# Patient Record
Sex: Female | Born: 1965 | Race: Black or African American | Hispanic: No | Marital: Single | Smoking: Current every day smoker
Health system: Southern US, Community
[De-identification: ages and names within clinical notes are randomized; demographics above are authoritative.]

## PROBLEM LIST (undated history)

## (undated) DIAGNOSIS — H579 Unspecified disorder of eye and adnexa: Secondary | ICD-10-CM

## (undated) HISTORY — PX: ABDOMINAL HYSTERECTOMY: SHX81

## (undated) HISTORY — PX: EYE SURGERY: SHX253

---

## 2002-01-17 ENCOUNTER — Emergency Department (HOSPITAL_COMMUNITY): Admission: EM | Admit: 2002-01-17 | Discharge: 2002-01-17 | Payer: Self-pay | Admitting: Unknown Physician Specialty

## 2003-06-30 ENCOUNTER — Emergency Department (HOSPITAL_COMMUNITY): Admission: AD | Admit: 2003-06-30 | Discharge: 2003-07-01 | Payer: Self-pay | Admitting: Emergency Medicine

## 2003-10-16 ENCOUNTER — Emergency Department (HOSPITAL_COMMUNITY): Admission: EM | Admit: 2003-10-16 | Discharge: 2003-10-16 | Payer: Self-pay | Admitting: Emergency Medicine

## 2004-04-23 ENCOUNTER — Emergency Department (HOSPITAL_COMMUNITY): Admission: EM | Admit: 2004-04-23 | Discharge: 2004-04-23 | Payer: Self-pay | Admitting: Emergency Medicine

## 2017-01-17 ENCOUNTER — Ambulatory Visit (HOSPITAL_COMMUNITY)
Admission: EM | Admit: 2017-01-17 | Discharge: 2017-01-17 | Disposition: A | Discharge status: Home / Self Care | Payer: Medicare Other | Attending: Internal Medicine | Admitting: Internal Medicine

## 2017-01-17 ENCOUNTER — Encounter (HOSPITAL_COMMUNITY): Payer: Self-pay | Admitting: *Deleted

## 2017-01-17 DIAGNOSIS — W57XXXA Bitten or stung by nonvenomous insect and other nonvenomous arthropods, initial encounter: Secondary | ICD-10-CM

## 2017-01-17 DIAGNOSIS — S00261A Insect bite (nonvenomous) of right eyelid and periocular area, initial encounter: Secondary | ICD-10-CM

## 2017-01-17 HISTORY — DX: Unspecified disorder of eye and adnexa: H57.9

## 2017-01-17 NOTE — ED Triage Notes (Signed)
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

## 2017-01-17 NOTE — Discharge Instructions (Signed)
Continue warm compresses. Take over the counter antihistamine for insect bite. Can get baby shampoo for lid scrubs. Monitor for worsening of symptoms, increased swelling/redness/warmth, follow up for reevaluation.

## 2017-01-17 NOTE — ED Provider Notes (Signed)
CSN: [id number]     Arrival date & time 01/17/17  1752 History   None    Chief Complaint  Patient presents with  . 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 She is noticing itchiness, burning sensation around the bite with swelling. She has been using warm compresses with minimal relief. No redness in the eye, discharge. No pain with eye movement. Denies fever, chills, night sweats. Denies trouble breathing, swelling of throat, trouble swallowing.       Past Medical History:  Diagnosis Date  . Eye disorder    Past Surgical History:  Procedure Laterality Date  . ABDOMINAL HYSTERECTOMY    . EYE SURGERY     History reviewed. No pertinent family history. Social History  Substance Use Topics  . Smoking status: Current Every Day Smoker  . Smokeless tobacco: Not on file  . Alcohol use No   OB History    No data available     Review of Systems  Reason unable to perform ROS: HPI as above.    Allergies  Patient has no known allergies.  Home Medications   Prior to Admission medications   Not on File   Meds Ordered and Administered this Visit  Medications - No data to display  BP (!) 142/104 (BP Location: Right Arm)   Pulse 78   Temp 98.6 F (37 C) (Oral)   Resp 18   SpO2 100%  No data found.   Physical Exam  Constitutional: She is oriented to person, place, and time. She appears well-developed and well-nourished. No distress.  HENT:  Head: Normocephalic and atraumatic.  Eyes:  Blind in right eye. No injection of the conjunctivae. Normal EOM. Redness and swelling of the right upper lid, no increased warmth.   Left eye with normal exam.   Neurological: She is alert and oriented to person, place, and time.  Psychiatric: She has a normal mood and affect. Her behavior is normal. Judgment normal.     Urgent Care Course     Procedures (including critical care time)  Labs Review Labs Reviewed - No data to display  Imaging Review No results found.     MDM   1. Insect bite, initial encounter    Discussed with patient local reaction to bug bite to the right eyelid. No signs of infection today. Continue warm compresses. Can take otc antihistamines. Baby shampoo for lid scrubs as needed. Patient to monitor for worsening of symptoms, pain with eye movement, to follow up for reevaluation.    Belinda FisherYu, Darren Caldron V, PA-C 01/17/17 1934

## 2019-03-05 ENCOUNTER — Other Ambulatory Visit: Payer: Self-pay | Admitting: Family Medicine

## 2019-03-05 DIAGNOSIS — Z1231 Encounter for screening mammogram for malignant neoplasm of breast: Secondary | ICD-10-CM

## 2019-08-10 ENCOUNTER — Other Ambulatory Visit: Payer: Self-pay

## 2019-08-10 DIAGNOSIS — Z20822 Contact with and (suspected) exposure to covid-19: Secondary | ICD-10-CM

## 2019-08-11 LAB — NOVEL CORONAVIRUS, NAA: SARS-CoV-2, NAA: NOT DETECTED

## 2020-03-23 ENCOUNTER — Other Ambulatory Visit: Payer: Self-pay | Admitting: General Practice

## 2020-03-23 DIAGNOSIS — Z1231 Encounter for screening mammogram for malignant neoplasm of breast: Secondary | ICD-10-CM

## 2020-04-07 ENCOUNTER — Other Ambulatory Visit: Payer: Self-pay

## 2020-04-07 ENCOUNTER — Ambulatory Visit
Admission: RE | Admit: 2020-04-07 | Discharge: 2020-04-07 | Disposition: A | Discharge status: Home / Self Care | Payer: Medicare Other | Source: Ambulatory Visit | Attending: General Practice | Admitting: General Practice

## 2020-04-07 DIAGNOSIS — Z1231 Encounter for screening mammogram for malignant neoplasm of breast: Secondary | ICD-10-CM

## 2020-10-10 ENCOUNTER — Other Ambulatory Visit: Payer: Self-pay

## 2020-10-10 ENCOUNTER — Encounter (HOSPITAL_COMMUNITY): Payer: Self-pay

## 2020-10-10 ENCOUNTER — Ambulatory Visit (HOSPITAL_COMMUNITY)
Admission: EM | Admit: 2020-10-10 | Discharge: 2020-10-10 | Disposition: A | Discharge status: Home / Self Care | Payer: Medicare Other | Attending: Urgent Care | Admitting: Urgent Care

## 2020-10-10 DIAGNOSIS — K529 Noninfective gastroenteritis and colitis, unspecified: Secondary | ICD-10-CM

## 2020-10-10 DIAGNOSIS — R112 Nausea with vomiting, unspecified: Secondary | ICD-10-CM

## 2020-10-10 MED ORDER — ONDANSETRON 4 MG PO TBDP
ORAL_TABLET | ORAL | Status: AC
  Filled 2020-10-10: qty 2

## 2020-10-10 MED ORDER — ONDANSETRON 8 MG PO TBDP: 8.0000 mg | ORAL_TABLET | Freq: Three times a day (TID) | ORAL | 0 refills | Status: DC | PRN

## 2020-10-10 MED ORDER — ONDANSETRON 4 MG PO TBDP
8.0000 mg | ORAL_TABLET | Freq: Once | ORAL | Status: AC
  Administered 2020-10-10: 8 mg via ORAL

## 2020-10-10 NOTE — ED Triage Notes (Signed)
Pt presents with vomiting and headache since waking up this morning.

## 2020-10-10 NOTE — ED Provider Notes (Signed)
[facility]   MRN: [id number] DOB: [birth year]  Subjective:   Toni Henderson is a 55 y.o. female presenting for acute onset this morning of persistent nausea with vomiting and a subsequent headache.  Denies confusion, weakness, numbness or tingling, chest pain, abdominal pain, diarrhea, constipation, hematemesis, bloody stools.  Denies history of stroke, heart disease, MI.  She did try an over-the-counter stomach medication but she cannot remember the name of it but states that it did not help.  No current facility-administered medications for this encounter. No current outpatient medications on file.   No Known Allergies  Past Medical History:  Diagnosis Date  . Eye disorder      Past Surgical History:  Procedure Laterality Date  . ABDOMINAL HYSTERECTOMY    . EYE SURGERY      Family History  Family history unknown: Yes    Social History   Tobacco Use  . Smoking status: Current Every Day Smoker  Substance Use Topics  . Alcohol use: No    ROS   Objective:   Vitals: BP (!) 142/94 (BP Location: Right Arm)   Pulse 69   Temp 97.7 F (36.5 C) (Oral)   Resp 17   SpO2 99%   Physical Exam Constitutional:      General: She is not in acute distress.    Appearance: Normal appearance. She is well-developed and normal weight. She is not ill-appearing, toxic-appearing or diaphoretic.  HENT:     Head: Normocephalic and atraumatic.     Right Ear: External ear normal.     Left Ear: External ear normal.     Nose: Nose normal.     Mouth/Throat:     Mouth: Mucous membranes are moist.     Pharynx: Oropharynx is clear.  Eyes:     General: No scleral icterus.    Extraocular Movements: Extraocular movements intact.     Pupils: Pupils are equal, round, and reactive to light.  Cardiovascular:     Rate and Rhythm: Normal rate and regular rhythm.     Pulses: Normal pulses.     Heart sounds: Normal heart sounds. No murmur heard. No friction rub. No gallop.    Pulmonary:     Effort: Pulmonary effort is normal. No respiratory distress.     Breath sounds: Normal breath sounds. No stridor. No wheezing, rhonchi or rales.  Abdominal:     General: There is no distension.     Palpations: Abdomen is soft. There is no mass.     Tenderness: There is no abdominal tenderness. There is no right CVA tenderness, left CVA tenderness, guarding or rebound.     Comments: Increased bowel sounds.  Skin:    General: Skin is warm and dry.     Coloration: Skin is not pale.     Findings: No rash.  Neurological:     General: No focal deficit present.     Mental Status: She is alert and oriented to person, place, and time.     Cranial Nerves: No cranial nerve deficit.     Motor: No weakness.     Coordination: Coordination normal.     Gait: Gait normal.     Deep Tendon Reflexes: Reflexes normal.  Psychiatric:        Mood and Affect: Mood normal.        Behavior: Behavior normal.        Thought Content: Thought content normal.        Judgment: Judgment normal.  Assessment and Plan :   PDMP not reviewed this encounter.  1. Gastroenteritis   2. Nausea and vomiting, intractability of vomiting not specified, unspecified vomiting type     P.o. Zofran given in clinic.  Will manage for suspected viral gastroenteritis with supportive care.  Recommended patient hydrate well, eat light meals and maintain electrolytes.  Will use Zofran and Imodium for nausea, vomiting and diarrhea. Counseled patient on potential for adverse effects with medications prescribed/recommended today, ER and return-to-clinic precautions discussed, patient verbalized understanding.    Wallis Bamberg, PA-C 10/10/20 1734

## 2020-10-10 NOTE — Discharge Instructions (Addendum)

## 2020-10-13 ENCOUNTER — Other Ambulatory Visit: Payer: Self-pay | Admitting: Family Medicine

## 2020-10-13 DIAGNOSIS — R748 Abnormal levels of other serum enzymes: Secondary | ICD-10-CM

## 2020-11-07 ENCOUNTER — Ambulatory Visit
Admission: RE | Admit: 2020-11-07 | Discharge: 2020-11-07 | Disposition: A | Discharge status: Home / Self Care | Payer: Medicare HMO | Source: Ambulatory Visit | Attending: Family Medicine | Admitting: Family Medicine

## 2020-11-07 DIAGNOSIS — R748 Abnormal levels of other serum enzymes: Secondary | ICD-10-CM

## 2021-07-27 IMAGING — MG DIGITAL SCREENING BILAT W/ TOMO W/ CAD
8 series · 8 of 24 positions shown · non-contrast
Comparison: None.

CLINICAL DATA: Screening.

EXAM:
DIGITAL SCREENING BILATERAL MAMMOGRAM WITH TOMO AND CAD

[L CC synth-2D]
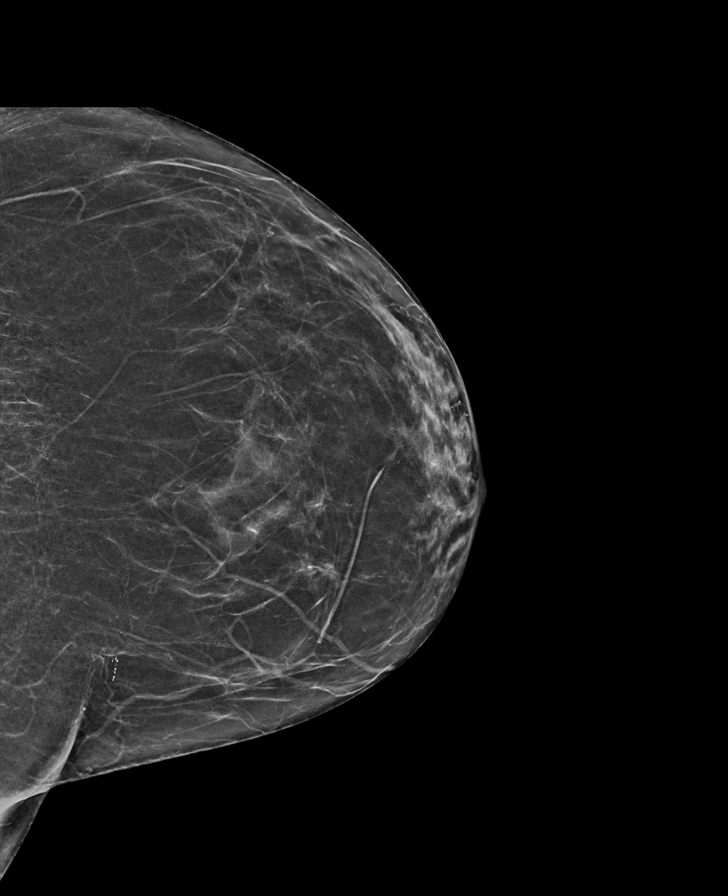

[L MLO synth-2D]
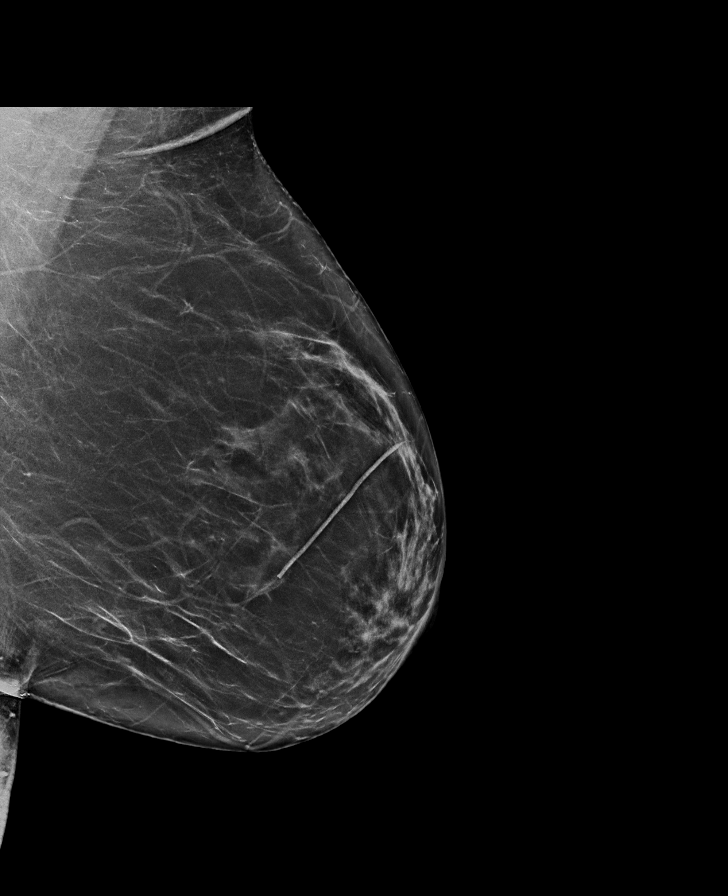

[R MLO synth-2D]
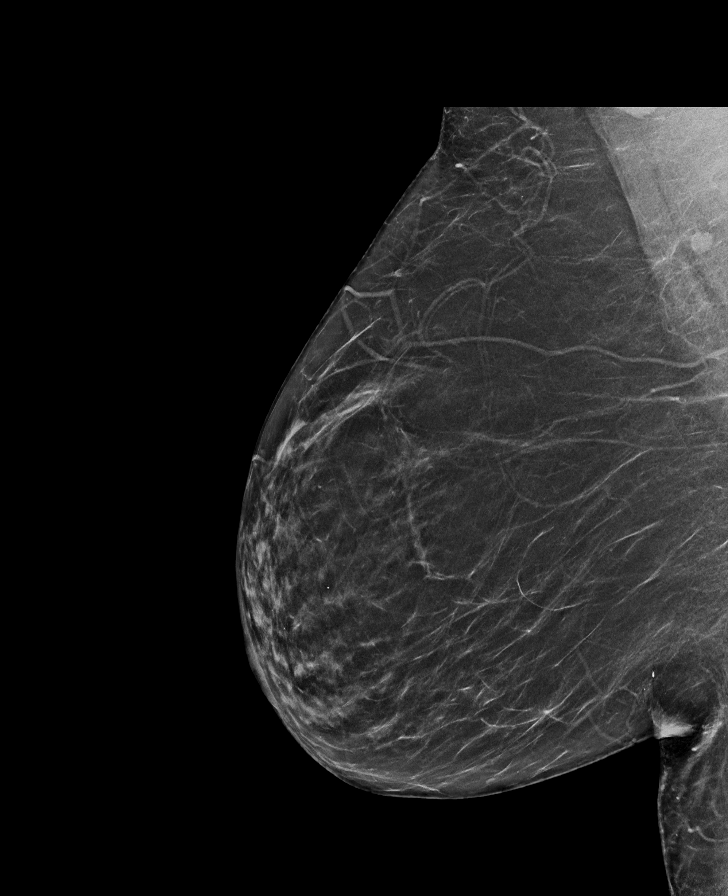

[R CC synth-2D]
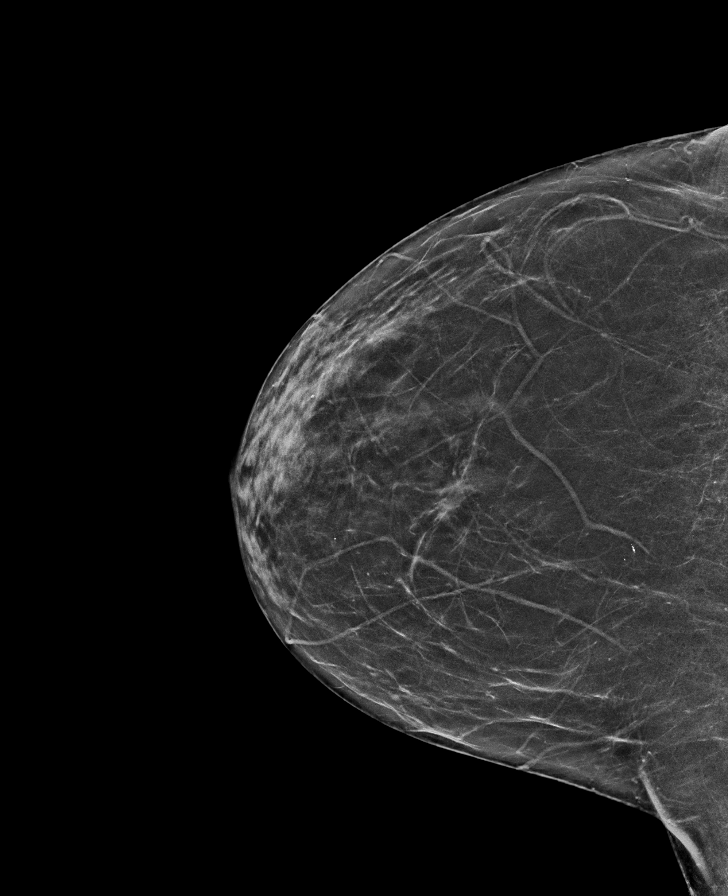

[L MLO tomo · tomo slice 36/71.0]
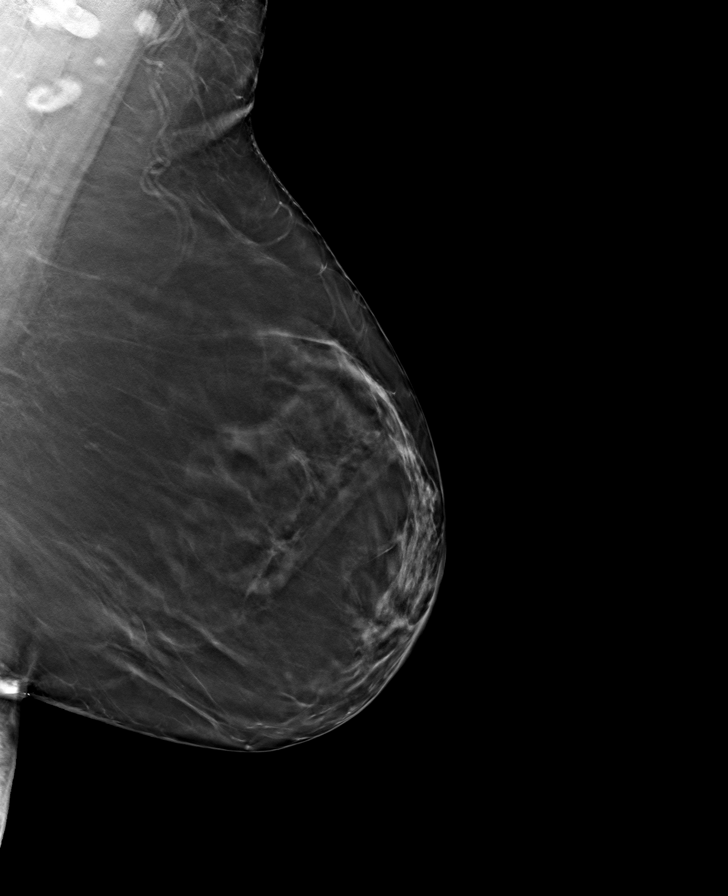

[R MLO tomo · tomo slice 33/66.0]
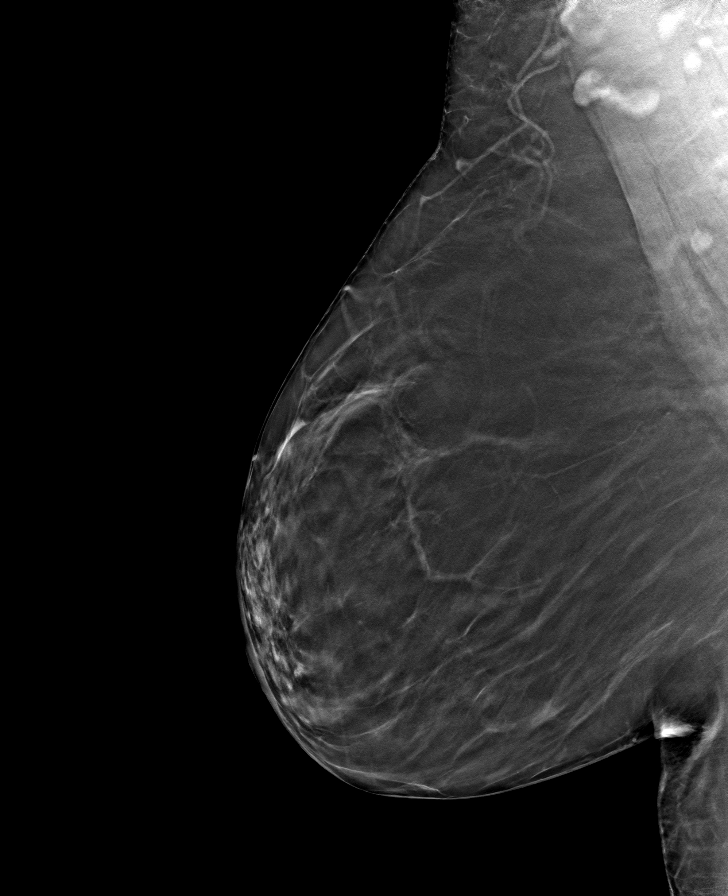

[L CC tomo · tomo slice 29/57.0]
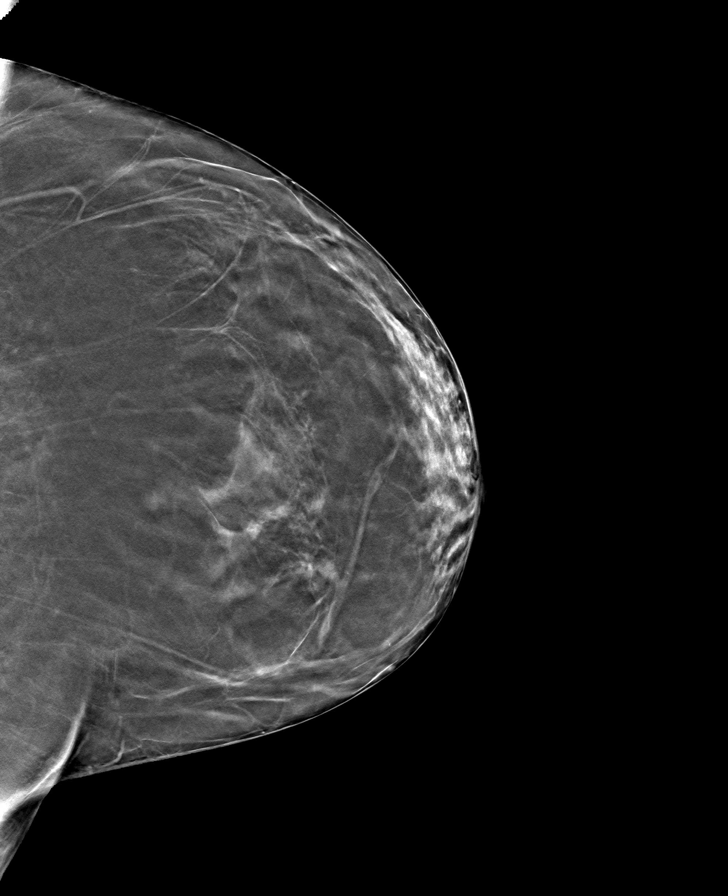

[R CC tomo · tomo slice 31/61.0]
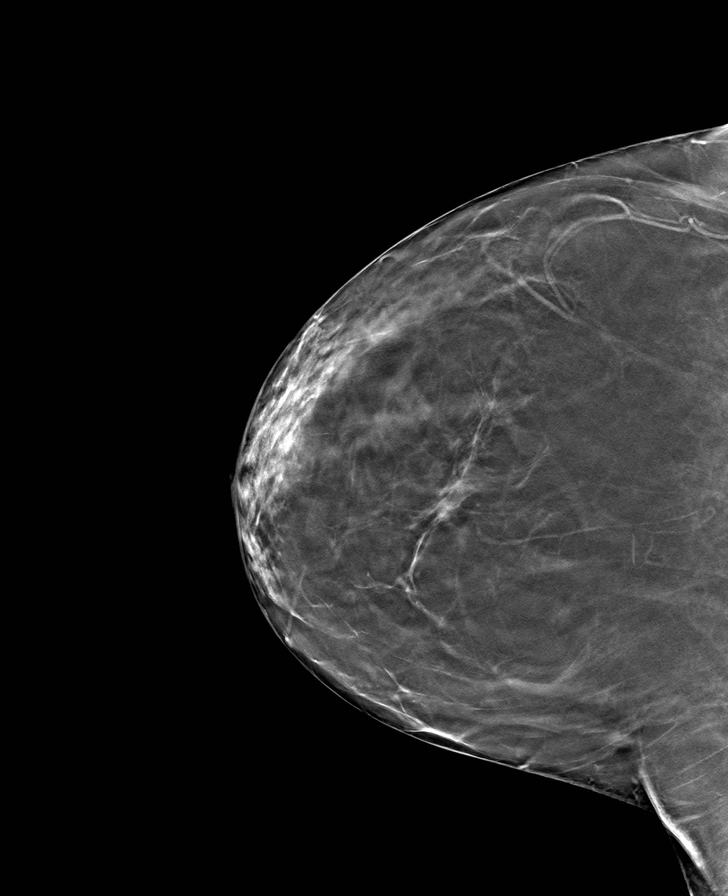

[8 of 24 positions shown; findings below may reference images not displayed]

ACR Breast Density Category b: There are scattered areas of
fibroglandular density.
FINDINGS: There are no findings suspicious for malignancy. Images were
processed with CAD.
IMPRESSION: No mammographic evidence of malignancy. A result letter of this
screening mammogram will be mailed directly to the patient.

RECOMMENDATION:
Screening mammogram in one year. (Code:Y5-G-EJ6)

BI-RADS CATEGORY  1: Negative.

## 2021-11-21 ENCOUNTER — Encounter: Payer: Self-pay | Admitting: Family Medicine

## 2021-12-07 ENCOUNTER — Other Ambulatory Visit: Payer: Self-pay | Admitting: Family Medicine

## 2021-12-07 DIAGNOSIS — Z1231 Encounter for screening mammogram for malignant neoplasm of breast: Secondary | ICD-10-CM

## 2021-12-11 ENCOUNTER — Ambulatory Visit: Payer: Medicare HMO

## 2022-01-26 LAB — GLUCOSE, POCT (MANUAL RESULT ENTRY): POC Glucose: 93 mg/dl (ref 70–99)

## 2022-02-26 IMAGING — US US ABDOMEN LIMITED RUQ/ASCITES
1 series · 14 of 25 positions shown · non-contrast
Comparison: None.

CLINICAL DATA: Elevated LFTs

EXAM:
ULTRASOUND ABDOMEN LIMITED RIGHT UPPER QUADRANT

[Series 1: us abdomen limited ruq/ascites · 0.23mm/px · 14 of 58 slices shown]
[im 1/58]
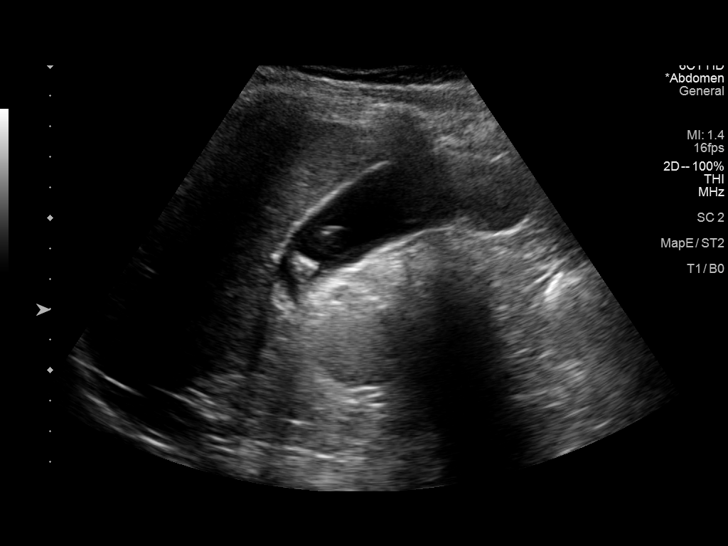
[im 5/58]
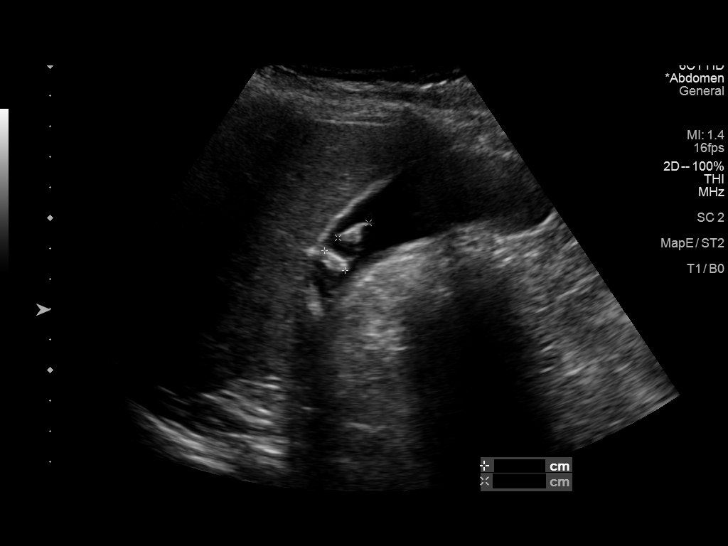
[im 10/58]
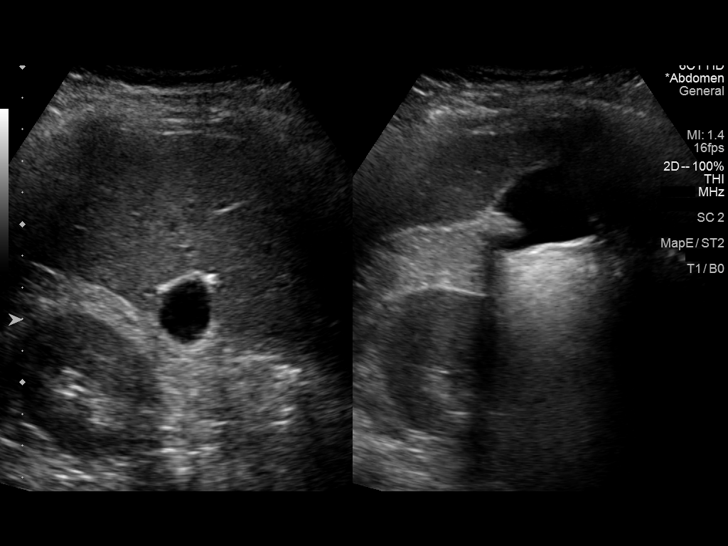
[im 15/58]
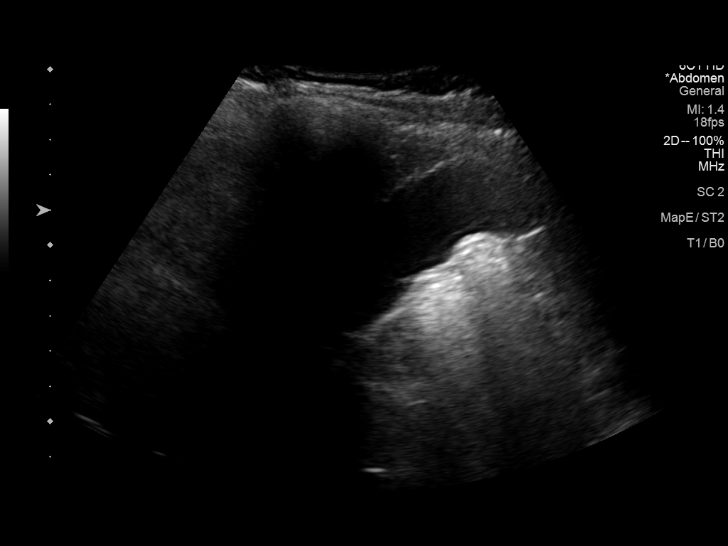
[im 20/58]
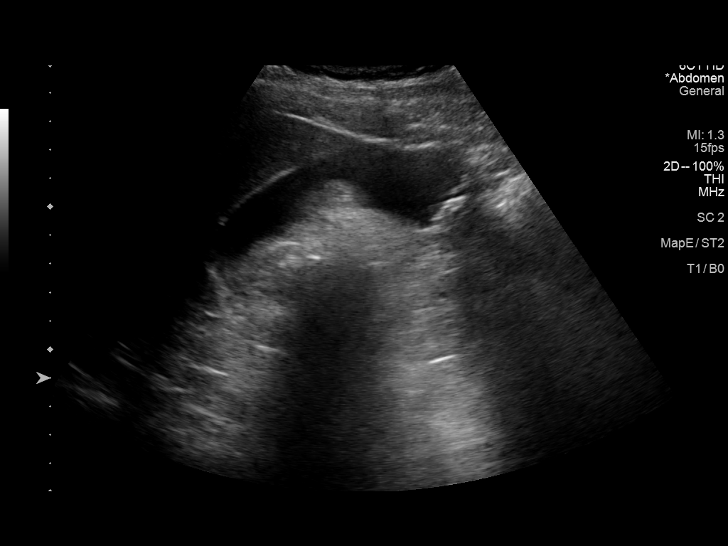
[im 22/58]
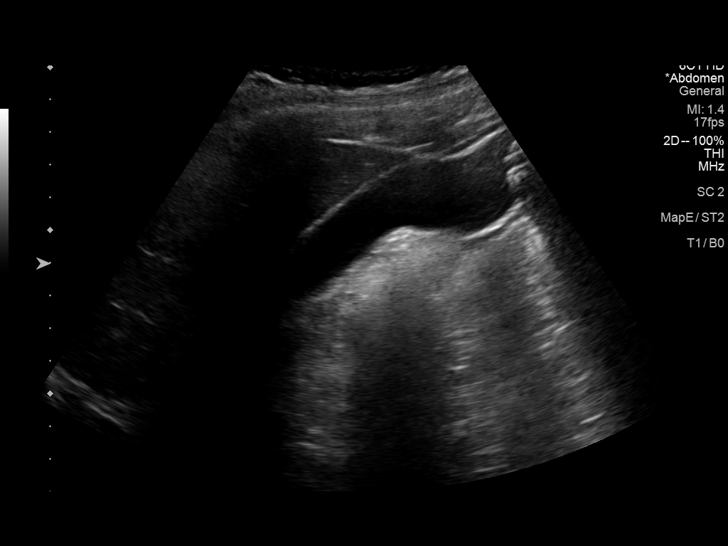
[im 27/58]
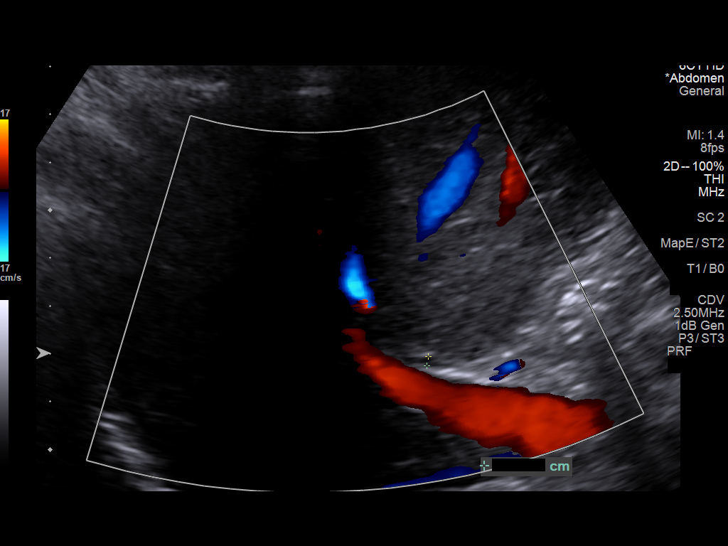
[im 31/58]
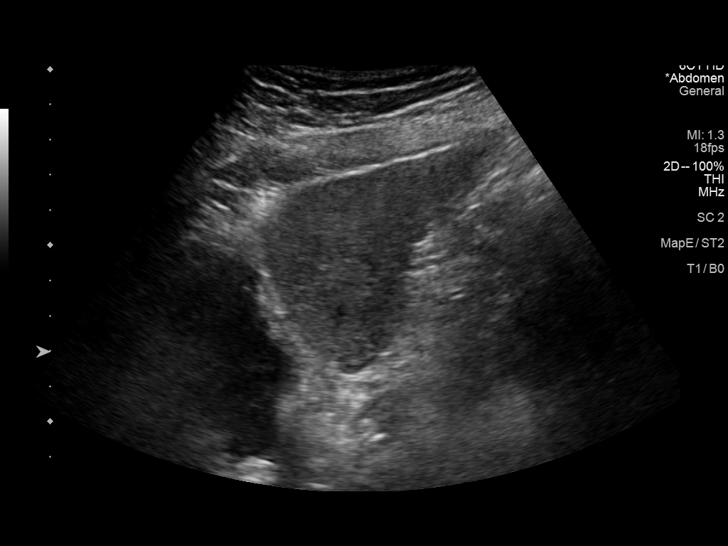
[im 36/58]
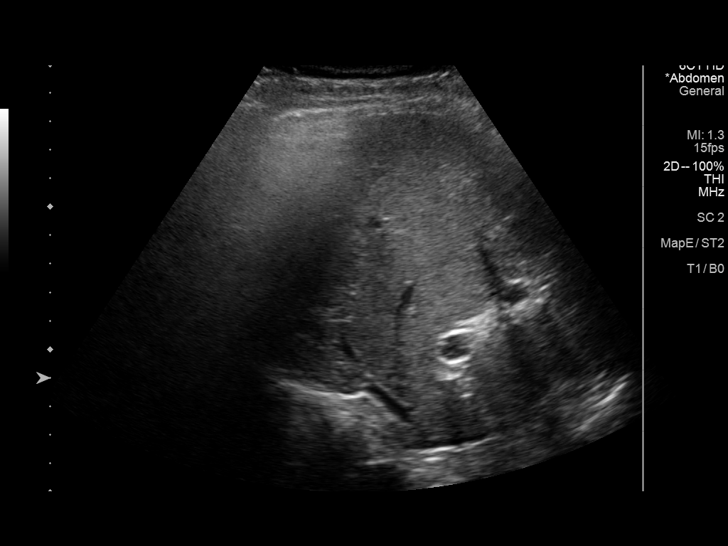
[im 39/58]
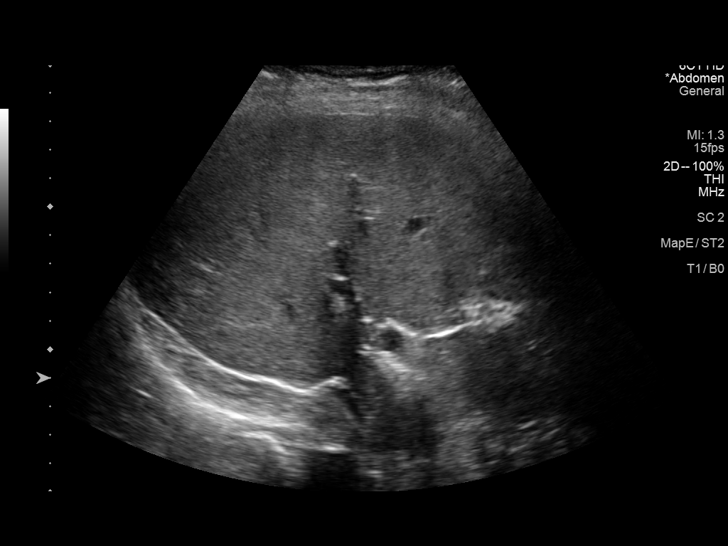
[im 43/58]
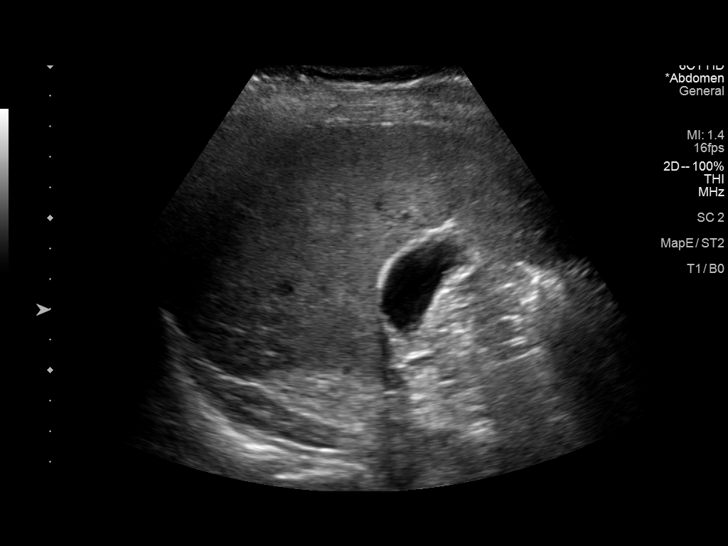
[im 48/58]
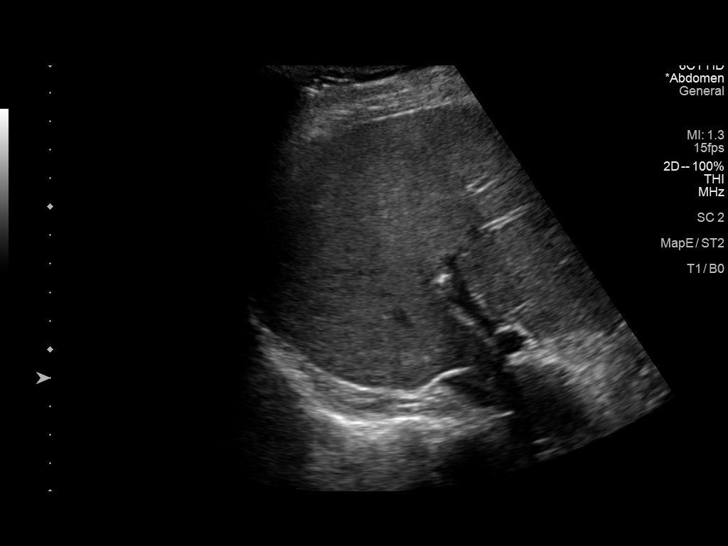
[im 53/58]
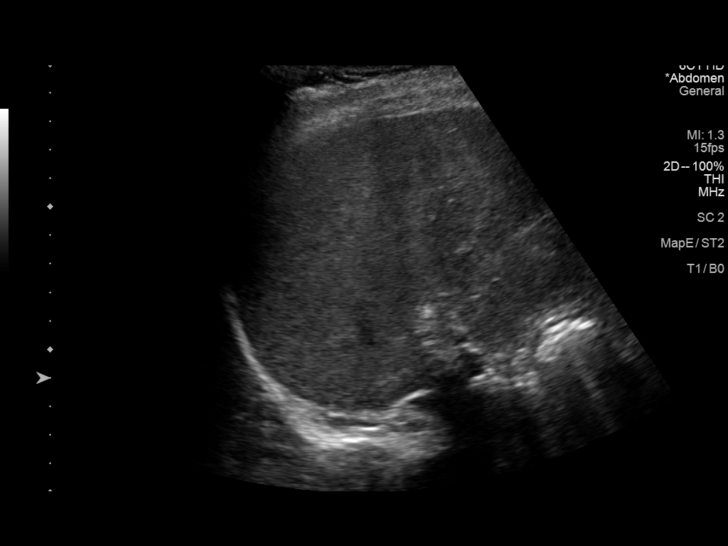
[im 58/58]
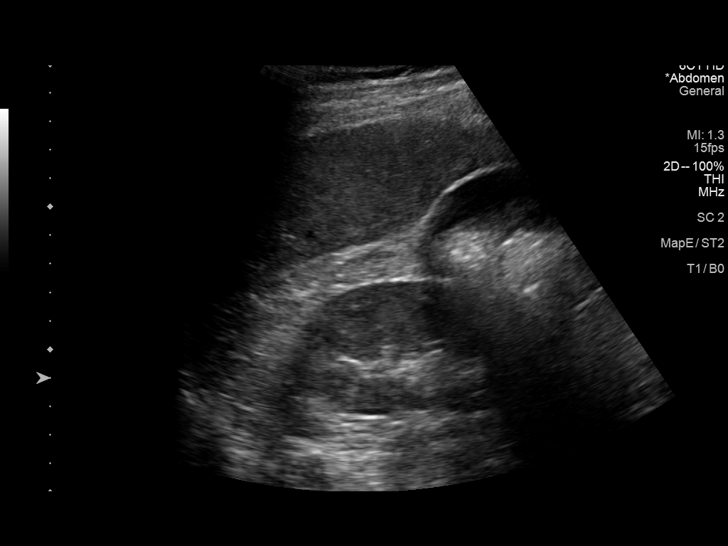

[14 of 25 positions shown; findings below may reference images not displayed]

FINDINGS: Gallbladder:

Multiple small shadowing gallstones measuring up to 1.1 cm. No
gallbladder wall thickening. No sonographic Murphy sign noted by
sonographer.

Common bile duct:

Diameter: 0.2 cm, within normal limits

Liver:

No focal lesion identified. Within normal limits in parenchymal
echogenicity. Portal vein is patent on color Doppler imaging with
normal direction of blood flow towards the liver.

Other: None.
IMPRESSION: Cholelithiasis. Otherwise unremarkable sonographic exam of the right
upper quadrant.

## 2023-03-12 ENCOUNTER — Other Ambulatory Visit: Payer: Self-pay | Admitting: Family Medicine

## 2023-03-12 DIAGNOSIS — R748 Abnormal levels of other serum enzymes: Secondary | ICD-10-CM

## 2023-06-12 ENCOUNTER — Ambulatory Visit (HOSPITAL_COMMUNITY)
Admission: EM | Admit: 2023-06-12 | Discharge: 2023-06-12 | Disposition: A | Discharge status: Home / Self Care | Payer: 59 | Attending: Family Medicine | Admitting: Family Medicine

## 2023-06-12 ENCOUNTER — Encounter (HOSPITAL_COMMUNITY): Payer: Self-pay | Admitting: *Deleted

## 2023-06-12 DIAGNOSIS — M79604 Pain in right leg: Secondary | ICD-10-CM | POA: Diagnosis not present

## 2023-06-12 MED ORDER — KETOROLAC TROMETHAMINE 30 MG/ML IJ SOLN
30.0000 mg | Freq: Once | INTRAMUSCULAR | Status: AC
  Administered 2023-06-12: 30 mg via INTRAMUSCULAR

## 2023-06-12 MED ORDER — TIZANIDINE HCL 4 MG PO TABS: 2.0000 mg | ORAL_TABLET | Freq: Three times a day (TID) | ORAL | 0 refills | Status: AC | PRN

## 2023-06-12 MED ORDER — KETOROLAC TROMETHAMINE 30 MG/ML IJ SOLN
INTRAMUSCULAR | Status: AC
  Filled 2023-06-12: qty 1

## 2023-06-12 MED ORDER — PREDNISONE 20 MG PO TABS: 40.0000 mg | ORAL_TABLET | Freq: Every day | ORAL | 0 refills | Status: AC

## 2023-06-12 NOTE — Discharge Instructions (Signed)
You have been given a shot of Toradol 30 mg today.  Take prednisone 20 mg--2 daily for 5 days   Take tizanidine 4 mg--1/2-1 tablet every 8 hours as needed for muscle spasms; this medication can cause dizziness and sleepiness

## 2023-06-12 NOTE — ED Provider Notes (Signed)
[facility]    CSN: [id number] Arrival date & time: 06/12/23  1536      History   Chief Complaint Chief Complaint  Patient presents with   Leg Pain    HPI Toni Henderson is a 57 y.o. female.    Leg Pain Here for pain in her right posterior thigh.  It began about 3 days or 4 days ago.  No recent trauma or injury.  It is mainly kind of tender when she is sitting on it or lying on it and then it bothers her more when she is standing or walking.  No fever and no rash.  No tingling or itching there. No swelling of the foot  She has a history of hypertension but is unaware of any diagnosis of chronic kidney disease.  I cannot find any labs in care everywhere or in our epic.    She is treated for hypertension but forgot to take her medicine today  No known drug allergies Past Medical History:  Diagnosis Date   Eye disorder     There are no active problems to display for this patient.   Past Surgical History:  Procedure Laterality Date   ABDOMINAL HYSTERECTOMY     EYE SURGERY      OB History   No obstetric history on file.      Home Medications    Prior to Admission medications   Medication Sig Start Date End Date Taking? Authorizing Provider  predniSONE (DELTASONE) 20 MG tablet Take 2 tablets (40 mg total) by mouth daily with breakfast for 5 days. 06/12/23 06/17/23 Yes Risha Barretta, Janace Aris, MD  tiZANidine (ZANAFLEX) 4 MG tablet Take 0.5-1 tablets (2-4 mg total) by mouth every 8 (eight) hours as needed for muscle spasms. 06/12/23  Yes Caryle Helgeson, Janace Aris, MD    Family History Family History  Family history unknown: Yes    Social History Social History   Tobacco Use   Smoking status: Every Day  Vaping Use   Vaping status: Never Used  Substance Use Topics   Alcohol use: No   Drug use: Not Currently     Allergies   Patient has no known allergies.   Review of Systems Review of Systems   Physical Exam Triage Vital Signs ED Triage Vitals   Encounter Vitals Group     BP 06/12/23 1610 (!) 149/112     Systolic BP Percentile --      Diastolic BP Percentile --      Pulse Rate 06/12/23 1610 84     Resp 06/12/23 1610 18     Temp 06/12/23 1610 98.4 F (36.9 C)     Temp Source 06/12/23 1610 Oral     SpO2 06/12/23 1610 95 %     Weight --      Height --      Head Circumference --      Peak Flow --      Pain Score 06/12/23 1608 7     Pain Loc --      Pain Education --      Exclude from Growth Chart --    No data found.  Updated Vital Signs BP (!) 149/112 (BP Location: Left Arm) Comment: she states she hasnt taken her BP med today.  Pulse 84   Temp 98.4 F (36.9 C) (Oral)   Resp 18   SpO2 95%   Visual Acuity Right Eye Distance:   Left Eye Distance:   Bilateral Distance:  Right Eye Near:   Left Eye Near:    Bilateral Near:     Physical Exam Vitals reviewed.  Constitutional:      General: She is not in acute distress.    Appearance: She is not ill-appearing, toxic-appearing or diaphoretic.  Musculoskeletal:     Cervical back: Neck supple.     Comments: There is no edema of the right lower leg and is very mildly tender in the mid posterior thigh.  There is no mass or induration.  Lymphadenopathy:     Cervical: No cervical adenopathy.  Skin:    Coloration: Skin is not jaundiced or pale.  Neurological:     General: No focal deficit present.     Mental Status: She is alert and oriented to person, place, and time.  Psychiatric:        Behavior: Behavior normal.      UC Treatments / Results  Labs (all labs ordered are listed, but only abnormal results are displayed) Labs Reviewed - No data to display  EKG   Radiology No results found.  Procedures Procedures (including critical care time)  Medications Ordered in UC Medications  ketorolac (TORADOL) 30 MG/ML injection 30 mg (has no administration in time range)    Initial Impression / Assessment and Plan / UC Course  I have reviewed the triage  vital signs and the nursing notes.  Pertinent labs & imaging results that were available during my care of the patient were reviewed by me and considered in my medical decision making (see chart for details).     Tizanidine is sent in for muscle relaxer.  Toradol injection is given here and 5 days of prednisone are sent in as an anti-inflammatory. Final Clinical Impressions(s) / UC Diagnoses   Final diagnoses:  Right leg pain     Discharge Instructions      You have been given a shot of Toradol 30 mg today.  Take prednisone 20 mg--2 daily for 5 days   Take tizanidine 4 mg--1/2-1 tablet every 8 hours as needed for muscle spasms; this medication can cause dizziness and sleepiness      ED Prescriptions     Medication Sig Dispense Auth. Provider   predniSONE (DELTASONE) 20 MG tablet Take 2 tablets (40 mg total) by mouth daily with breakfast for 5 days. 10 tablet Zenia Resides, MD   tiZANidine (ZANAFLEX) 4 MG tablet Take 0.5-1 tablets (2-4 mg total) by mouth every 8 (eight) hours as needed for muscle spasms. 15 tablet Zyona Pettaway, Janace Aris, MD      PDMP not reviewed this encounter.   Zenia Resides, MD 06/12/23 [phone]

## 2023-06-12 NOTE — ED Triage Notes (Signed)
Pt states she has pain in the back of her right upper leg X 4 days. She has been taking tylenol without relief. She is using a cane to walk due to the pain. The pain isnt constant, only when she is putting weight on it.

## 2024-04-28 DIAGNOSIS — E785 Hyperlipidemia, unspecified: Secondary | ICD-10-CM | POA: Insufficient documentation

## 2024-04-28 NOTE — Progress Notes (Deleted)
    Cardiology Office Note   Date:  04/28/2024   ID:  Toni Henderson, Toni Henderson 1966/02/01, MRN [id number]  PCP:  Maree Leni Edyth DELENA, MD  Cardiologist:   None Referring:  ***  No chief complaint on file.     History of Present Illness: Toni Henderson is a 58 y.o. female who presents for evaluation of dyslipidemia.  ***       Past Medical History:  Diagnosis Date   Eye disorder     Past Surgical History:  Procedure Laterality Date   ABDOMINAL HYSTERECTOMY     EYE SURGERY       Current Outpatient Medications  Medication Sig Dispense Refill   tiZANidine  (ZANAFLEX ) 4 MG tablet Take 0.5-1 tablets (2-4 mg total) by mouth every 8 (eight) hours as needed for muscle spasms. 15 tablet 0   No current facility-administered medications for this visit.    Allergies:   Patient has no known allergies.    Social History:  The patient  reports that she has been smoking. She does not have any smokeless tobacco history on file. She reports current alcohol use. She reports that she does not currently use drugs.   Family History:  The patient's ***Family history is unknown by patient.    ROS:  Please see the history of present illness.   Otherwise, review of systems are positive for {NONE DEFAULTED:18576}.   All other systems are reviewed and negative.    PHYSICAL EXAM: VS:  There were no vitals taken for this visit. , BMI There is no height or weight on file to calculate BMI. GENERAL:  Well appearing HEENT:  Pupils equal round and reactive, fundi not visualized, oral mucosa unremarkable NECK:  No jugular venous distention, waveform within normal limits, carotid upstroke brisk and symmetric, no bruits, no thyromegaly LYMPHATICS:  No cervical, inguinal adenopathy LUNGS:  Clear to auscultation bilaterally BACK:  No CVA tenderness CHEST:  Unremarkable HEART:  PMI not displaced or sustained,S1 and S2 within normal limits, no S3, no S4, no clicks, no rubs, *** murmurs ABD:  Flat, positive  bowel sounds normal in frequency in pitch, no bruits, no rebound, no guarding, no midline pulsatile mass, no hepatomegaly, no splenomegaly EXT:  2 plus pulses throughout, no edema, no cyanosis no clubbing SKIN:  No rashes no nodules NEURO:  Cranial nerves II through XII grossly intact, motor grossly intact throughout PSYCH:  Cognitively intact, oriented to person place and time    EKG:        Recent Labs: No results found for requested labs within last 365 days.    Lipid Panel No results found for: CHOL, TRIG, HDL, CHOLHDL, VLDL, LDLCALC, LDLDIRECT    Wt Readings from Last 3 Encounters:  No data found for Wt      Other studies Reviewed: Additional studies/ records that were reviewed today include: ***. Review of the above records demonstrates:  Please see elsewhere in the note.  ***   ASSESSMENT AND PLAN:  ***   Current medicines are reviewed at length with the patient today.  The patient {ACTIONS; HAS/DOES NOT HAVE:19233} concerns regarding medicines.  The following changes have been made:  {PLAN; NO CHANGE:13088:s}  Labs/ tests ordered today include: *** No orders of the defined types were placed in this encounter.    Disposition:   FU with ***    Signed, Lynwood Schilling, MD  04/28/2024 9:32 PM    [facility]

## 2024-04-29 ENCOUNTER — Ambulatory Visit: Attending: Internal Medicine | Admitting: Cardiology

## 2024-04-29 DIAGNOSIS — E785 Hyperlipidemia, unspecified: Secondary | ICD-10-CM
